# Patient Record
Sex: Female | Born: 1935 | Race: White | Hispanic: No | Marital: Married
Health system: Southern US, Community
[De-identification: ages and names within clinical notes are randomized; demographics above are authoritative.]

---

## 1997-12-28 ENCOUNTER — Other Ambulatory Visit: Admission: RE | Admit: 1997-12-28 | Discharge: 1997-12-28 | Payer: Self-pay | Admitting: Family Medicine

## 1998-08-20 ENCOUNTER — Encounter: Payer: Self-pay | Admitting: Emergency Medicine

## 1998-08-20 ENCOUNTER — Emergency Department (HOSPITAL_COMMUNITY): Admission: EM | Admit: 1998-08-20 | Discharge: 1998-08-20 | Payer: Self-pay | Admitting: Emergency Medicine

## 1999-12-30 ENCOUNTER — Ambulatory Visit (HOSPITAL_COMMUNITY): Admission: RE | Admit: 1999-12-30 | Discharge: 1999-12-30 | Payer: Self-pay | Admitting: Family Medicine

## 1999-12-30 ENCOUNTER — Encounter: Payer: Self-pay | Admitting: Family Medicine

## 2000-01-06 ENCOUNTER — Encounter: Payer: Self-pay | Admitting: Family Medicine

## 2000-01-06 ENCOUNTER — Encounter: Admission: RE | Admit: 2000-01-06 | Discharge: 2000-01-06 | Payer: Self-pay | Admitting: Family Medicine

## 2000-01-27 ENCOUNTER — Encounter: Admission: RE | Admit: 2000-01-27 | Discharge: 2000-02-19 | Payer: Self-pay | Admitting: Orthopaedic Surgery

## 2002-08-18 ENCOUNTER — Encounter: Payer: Self-pay | Admitting: Family Medicine

## 2002-08-18 ENCOUNTER — Encounter: Admission: RE | Admit: 2002-08-18 | Discharge: 2002-08-18 | Payer: Self-pay | Admitting: Family Medicine

## 2002-09-27 ENCOUNTER — Encounter: Admission: RE | Admit: 2002-09-27 | Discharge: 2002-09-27 | Payer: Self-pay | Admitting: Family Medicine

## 2002-09-27 ENCOUNTER — Encounter: Payer: Self-pay | Admitting: Family Medicine

## 2002-10-23 ENCOUNTER — Encounter: Payer: Self-pay | Admitting: Family Medicine

## 2002-10-23 ENCOUNTER — Encounter: Admission: RE | Admit: 2002-10-23 | Discharge: 2002-10-23 | Payer: Self-pay | Admitting: Family Medicine

## 2003-06-12 ENCOUNTER — Encounter: Admission: RE | Admit: 2003-06-12 | Discharge: 2003-06-12 | Payer: Self-pay | Admitting: Family Medicine

## 2003-06-12 ENCOUNTER — Encounter: Payer: Self-pay | Admitting: Family Medicine

## 2003-06-21 ENCOUNTER — Ambulatory Visit (HOSPITAL_COMMUNITY): Admission: RE | Admit: 2003-06-21 | Discharge: 2003-06-21 | Payer: Self-pay | Admitting: Family Medicine

## 2003-06-21 ENCOUNTER — Encounter: Payer: Self-pay | Admitting: Family Medicine

## 2003-10-23 ENCOUNTER — Encounter: Admission: RE | Admit: 2003-10-23 | Discharge: 2003-10-23 | Payer: Self-pay | Admitting: Family Medicine

## 2003-11-04 ENCOUNTER — Encounter: Admission: RE | Admit: 2003-11-04 | Discharge: 2003-11-04 | Payer: Self-pay | Admitting: Family Medicine

## 2003-11-13 ENCOUNTER — Ambulatory Visit (HOSPITAL_COMMUNITY): Admission: RE | Admit: 2003-11-13 | Discharge: 2003-11-13 | Payer: Self-pay | Admitting: Internal Medicine

## 2003-11-15 ENCOUNTER — Observation Stay (HOSPITAL_COMMUNITY): Admission: RE | Admit: 2003-11-15 | Discharge: 2003-11-16 | Payer: Self-pay | Admitting: Interventional Radiology

## 2003-11-15 ENCOUNTER — Encounter (INDEPENDENT_AMBULATORY_CARE_PROVIDER_SITE_OTHER): Payer: Self-pay | Admitting: Specialist

## 2003-12-06 ENCOUNTER — Encounter: Admission: RE | Admit: 2003-12-06 | Discharge: 2003-12-06 | Payer: Self-pay | Admitting: Family Medicine

## 2005-03-11 ENCOUNTER — Inpatient Hospital Stay (HOSPITAL_COMMUNITY): Admission: EM | Admit: 2005-03-11 | Discharge: 2005-03-14 | Payer: Self-pay | Admitting: Emergency Medicine

## 2006-04-13 ENCOUNTER — Encounter: Admission: RE | Admit: 2006-04-13 | Discharge: 2006-04-13 | Payer: Self-pay | Admitting: Family Medicine

## 2007-01-05 ENCOUNTER — Encounter: Admission: RE | Admit: 2007-01-05 | Discharge: 2007-01-05 | Payer: Self-pay | Admitting: Family Medicine

## 2007-01-18 ENCOUNTER — Ambulatory Visit (HOSPITAL_COMMUNITY): Admission: RE | Admit: 2007-01-18 | Discharge: 2007-01-18 | Payer: Self-pay | Admitting: Family Medicine

## 2007-01-22 ENCOUNTER — Encounter (INDEPENDENT_AMBULATORY_CARE_PROVIDER_SITE_OTHER): Payer: Self-pay | Admitting: Specialist

## 2007-01-22 ENCOUNTER — Ambulatory Visit (HOSPITAL_COMMUNITY): Admission: RE | Admit: 2007-01-22 | Discharge: 2007-01-22 | Payer: Self-pay | Admitting: Interventional Radiology

## 2007-02-05 ENCOUNTER — Ambulatory Visit (HOSPITAL_COMMUNITY): Admission: RE | Admit: 2007-02-05 | Discharge: 2007-02-05 | Payer: Self-pay | Admitting: Interventional Radiology

## 2007-02-15 ENCOUNTER — Ambulatory Visit (HOSPITAL_COMMUNITY): Admission: RE | Admit: 2007-02-15 | Discharge: 2007-02-15 | Payer: Self-pay | Admitting: Interventional Radiology

## 2008-12-27 ENCOUNTER — Emergency Department: Payer: Self-pay | Admitting: Emergency Medicine

## 2009-05-19 ENCOUNTER — Inpatient Hospital Stay: Payer: Self-pay | Admitting: Internal Medicine

## 2009-11-20 ENCOUNTER — Ambulatory Visit: Payer: Self-pay | Admitting: Family Medicine

## 2010-03-04 ENCOUNTER — Emergency Department: Payer: Self-pay | Admitting: Emergency Medicine

## 2010-06-09 ENCOUNTER — Inpatient Hospital Stay: Payer: Self-pay | Admitting: Internal Medicine

## 2010-11-14 ENCOUNTER — Ambulatory Visit: Payer: Self-pay | Admitting: Oncology

## 2010-11-15 ENCOUNTER — Observation Stay: Payer: Self-pay | Admitting: Specialist

## 2010-11-25 ENCOUNTER — Ambulatory Visit: Payer: Self-pay | Admitting: Oncology

## 2010-11-26 LAB — CEA: CEA: 1.4 ng/mL (ref 0.0–4.7)

## 2010-11-29 ENCOUNTER — Ambulatory Visit: Payer: Self-pay | Admitting: Oncology

## 2010-12-15 ENCOUNTER — Ambulatory Visit: Payer: Self-pay | Admitting: Oncology

## 2011-01-14 ENCOUNTER — Ambulatory Visit: Payer: Self-pay | Admitting: Oncology

## 2011-01-28 NOTE — Consult Note (Signed)
[name] [name], [name] [name]             ACCOUNT NO.:  [id number]   MEDICAL RECORD NO.:  [id number]          PATIENT TYPE:  OUT   LOCATION:  XRAY                         FACILITY:  [facility]   PHYSICIAN:  Sanjeev K. Deveshwar, M.D.DATE OF BIRTH:  04-05-1924   DATE OF CONSULTATION:  02/05/2007  DATE OF DISCHARGE:                                 CONSULTATION   CHIEF COMPLAINT:  Back pain.   HISTORY OF PRESENT ILLNESS:  This is a very pleasant, 77 year old female  with a history of compression fractures.  She had previously undergone  an L4 kyphoplasty on December 03, 2003, performed by Dr. Corliss Skains.  She  was seen recently in the office by Dr. Corliss Skains on Jan 18, 2007, once  again with back pain.  An MRI was performed at that time which showed an  acute fracture at L2.  The patient underwent a kyphoplasty at the L2  level on Jan 22, 2007.  She returns today accompanied by her daughter to  be seen in followup.   PAST MEDICAL HISTORY:  1. Hypertension.  2. History of previous acute renal failure.  3. Osteoporosis.  4. Mild Alzheimer's dementia.  5. Pyelonephritis.  6. History of TIAs.   PAST SURGICAL HISTORY:  1. Hysterectomy.  2. Cholecystectomy   ALLERGIES:  NO KNOWN DRUG ALLERGIES.   CURRENT MEDICATIONS:  Norvasc, aspirin, Vicodin and Aricept.   SOCIAL HISTORY:  The patient is married.  She has four children, two  daughters and two sons, one son is deceased.  One son has cancer.  Two  daughters are well.  She lives in [place] with her husband.  She is  a nonsmoker and a nondrinker.  She is retired from [employer].  She had been working as a Civil engineer, contracting represented until just recently.   FAMILY HISTORY:  Mother died at age 32 of unknown causes.  Her father  died in his 22s of cancer.  She has one sister who is alive, although  not well.   IMPRESSION:  As noted, the patient returns today to be seen in followup  after her L2 kyphoplasty performed Jan 22, 2007.  The patient  reports  that she continues to have severe pain.  She never had any relief  whatsoever following the kyphoplasty.  She had been on Percocet for  pain.  She ran out.  We did call her in a prescription for Vicodin.  She  has been taking Vicodin two tablets pretty much around the clock every 4-  6 hours.  However, she is not had not had any significant relief and her  pain.  She describes the pain as being a 10/10 scale.  She is pretty  much confined to bed and unable to do much in the way of self-care.  Her  daughter has been caring for her.  She has had a decreased appetite.  She denies any shortness of breath.  She has been somewhat constipated  from the pain medication.  She has not had a bowel movement in 2-3 days,  although she does not feel that this is totally abnormal for  her.   RECOMMENDATIONS:  Dr. Corliss Skains saw the patient.  He reviewed her  previous MRI as well as the results of the recent kyphoplasty.  The  situation was discussed with the patient and her daughter.  At this  point, there were felt to be two options, one to continue limited  mobility and pain medication for another week or two to see if her  symptoms improve on her own.  The second option would be to repeat an  MRI today to look for new fractures.  The decision was made to repeat  the MRI.  We are currently awaiting the results.  Greater than 30  minutes was spent on this consult.      Delton See, P.A.    ______________________________  Grandville Silos. Corliss Skains, M.D.    DR/MEDQ  D:  02/05/2007  T:  02/05/2007  Job:  [id number]   cc:   Quita Skye. Artis Flock, M.D.

## 2011-01-31 NOTE — H&P (Signed)
NAMEGWENDOLEN, [name] NO.:  [id number]   MEDICAL RECORD NO.:  [id number]          PATIENT TYPE:  EMS   LOCATION:  MAJO                         FACILITY:  [facility]   PHYSICIAN:  Hettie Holstein, D.O.    DATE OF BIRTH:  [birth year]   DATE OF ADMISSION:  03/11/2005  DATE OF DISCHARGE:                                HISTORY & PHYSICAL   PRIMARY CARE PHYSICIAN:  Rodolph Bong, M.D.   CHIEF COMPLAINT:  Poor intake and lightheadedness and nausea.   HISTORY OF PRESENT ILLNESS:  History is limited, as the patient is unable to  provide a thorough history due to intractable nausea and emesis. [name] is a pleasant 77 year old, independently living Caucasian female  with uncomplicated medical history who developed problems with nausea for  the past several days and inability to hold any food down because of nausea  and development of subjective fevers.  Presented to the emergency department  with finding of pyelonephritis clinically and renal insufficiency.   In the emergency department, she continued to have contractible nausea and  vomiting and generalized feeling unwell.  She is being admitted for IV  hydration, IV antibiotics and further observation.   PAST MEDICAL HISTORY:  Past medical history that she is able to provide at  present is:  1.  Previous history of hypertension.  2.  Status post cholecystectomy in the past.  3.  History of TIA in the past.  4.  Full hysterectomy in the past.   MEDICATIONS:  1.  Norvasc.  2.  She states she is on another blood pressure medication that she cannot      recall.  3.  Aspirin.   ALLERGIES:  No known drug allergies.   SOCIAL HISTORY:  The patient lives with her husband, denies tobacco for at  least greater than 10 years.  No alcohol.   FAMILY HISTORY:  Unobtainable at present.   REVIEW OF SYSTEMS:  Unobtainable at present.   PHYSICAL EXAMINATION:  VITAL SIGNS:  Blood pressure 119/53, temperature 100,  heart rate  91, respirations 18.  GENERAL APPEARANCE:  The patient is alert.  However, she is quite nauseated,  uncomfortable, vomiting no acute distress emesis and bilious emesis,  nonbloody.  HEENT:  Normocephalic, atraumatic.  Extraocular muscles intact.  Has  extremely poor dentition.  NECK:  Supple, nontender.  No palpable thyromegaly or mass.  CARDIOVASCULAR:  Normal S1, S2.  PULMONARY:  Lungs are clear bilaterally.  ABDOMEN:  Soft, nontender.  No acute costovertebral angle tenderness.  EXTREMITIES:  No edema.  NEUROLOGICAL:  Dysthymic.  Affect is stable.   LABORATORY DATA:  Based on the I-stat studies:  Sodium 134, potassium 3.0,  BUN 50, creatinine 1.9, glucose 158.  Urinalysis revealed WBC's at 2150, WBC  13, hemoglobin 12.5, platelets 206,000, MCV 87.   ASSESSMENT:  1.  Pyelonephritis.  2.  Hypertension.  3.  Extremely poor dentition.  4.  Acute renal failure, suspected secondary to volume depletion.  Will      follow.  Will need to discern which antihypertensive medication she was  on previously, as it may have been an ace inhibitor.  We will have to      question her when her acute issues resolve.  Will hold them for      toxications for now.   PLAN:  We are going to admit [name] [name] for IV antibiotics, IV hydration,  symptom management and supportive care.  Will follow her course clinically.  Checked renal ultrasound to evaluate her kidneys.       ESS/MEDQ  D:  03/11/2005  T:  03/11/2005  Job:  [id number]

## 2011-01-31 NOTE — Discharge Summary (Signed)
NAMEJAMIRRA, CURNOW             ACCOUNT NO.:  [id number]   MEDICAL RECORD NO.:  [id number]          PATIENT TYPE:  INP   LOCATION:  6729                         FACILITY:  [facility]   PHYSICIAN:  Lonia Blood, M.D.      DATE OF BIRTH:  12/11/1923   DATE OF ADMISSION:  03/11/2005  DATE OF DISCHARGE:                                 DISCHARGE SUMMARY   PRIMARY CARE PHYSICIAN:  Dr. Rodolph Bong.   DISCHARGE DIAGNOSES:  1.  Acute pyelonephritis.  2.  Dehydration with acute renal failure.  3.  Severe nausea and vomiting.  4.  Hypokalemia.  5.  Generalized debilitation.  6.  Hypertension.  7.  History of transient ischemic attack.   DISCHARGE MEDICATIONS:  1.  Ciprofloxacin 250 mg p.o. b.i.d. for five more days.  2.  K-Dur 20 mEq/L daily for five more days.  3.  Norvasc 5 mg daily as previously prescribed by Dr. Penni Bombard.  4.  The patient also supposedly took Avalide prior to admission and may      resume her medication at this point.   DISPOSITION:  The patient is discharged in very good health.  She is  tolerating all her meals and moving around.  She has been evaluated by PT/OT  in the hospital, and determined that she does not require home health PT or  OT.  The patient is also to follow with Dr. Markus Jarvis office on Monday,  where she will have a BMT checked to follow up with the level of her  potassium.  She is being discharged today after receiving two doses of 40  mEq per day of potassium for hypokalemia related to her nausea and vomiting.  The patient will also take a potassium supplement for the next three days  until she is seen in the office on Monday.  If her potassium has stayed  stable at that point, she will not require to continue potassium  supplements.   PROCEDURES PERFORMED:  1.  Head CT without contrast on March 11, 2005, showing atrophy without acute      intracranial abnormality.  2.  Chest x-ray on March 11, 2005, showed right apical pleural parenchymal  scarring, but no acute process.  3.  Renal ultrasound performed on March 11, 2005, shows tiny benign cysts in      the right kidney, otherwise unremarkable.   CONSULTATIONS:  None.   BRIEF HISTORY/PHYSICAL:  Please refer to the dictated history and physical  by Dr. Hannah Beat.  This is a 77 year old lady that was brought in  secondary to poor intake and intractable nausea and vomiting.  At the time  of arrival, she could not even give enough history due to continuous nausea  and vomiting, even at the emergency room.  The patient was also found at  that time to have acute renal insufficiency with a creatinine of 1.9, BUN  50, potassium of only 3.0.  Urinalysis revealed findings consistent with  UTI, as follows, positive nitrites, cloudy urine, moderate leukocyte  esterase.  Urine microscopy showed wbc's 21-50, rbc's 3-6, and many  bacteria.  She  was subsequently admitted for treatment of pyelonephritis as  a cause of her nausea and vomiting.   HOSPITAL COURSE:  Problem 1. Pyelonephritis.  The patient was initiated on  IV ciprofloxacin while urine culture and blood culture were sent out.  Her  urine culture came back as E. coli, which is pansensitive and resistant only  to ampicillin.  Since it is sensitive to quinolones, the patient was  maintained on her Cipro, which she reacted to tremendously.  She is  currently on oral antibiotics and will finish another five more days of  Cipro at home.  Her white count has remained normal from 10.0 on admission  to only 5.8 today.   Problem 2. Acute renal failure/dehydration.  This was most likely secondary  to her continuous nausea and vomiting.  The patient was aggressively  hydrated and has since become more or less hydrated.  Her antihypertensive  was held on admission due to relative hypotension, but now she is to resume  her Norvasc as well.   Problem 3. Hypokalemia.  The patient's potassium dropped as low at 2.5, most  likely secondary  to the intractable vomiting that she had prior to  admission.  This was initially repleted, and the process is still going on  at the time of this discharge.  She will need to have a potassium level  checked when she leaves the hospital.  I will see her on Monday.  I will  keep her on potassium supplements since her renal function is good.   Problem 4. Hypotension.  As indicated, the patient was dehydrated when she  came in, so she was initially hypotensive.  We took her off the Norvasc that  she was taking, but she can resume that at this present time with her  aspirin as previously taken.   Problem 5. Nausea/vomiting.  The patient's nausea and vomiting was most  likely secondary to her pyelonephritis.  She received IV Phenergan during  this hospitalization, as well as Zofran.  The patient's vomiting resolved  within the first 24 hours, and she was gradually restarted on her diet,  initially liquids and now a regular diet.   DISCHARGE LABORATORIES:  White count 5.8, hemoglobin 11.8, platelets  231,000.  Creatinine 0.7, BUN 4, potassium is still low at 2.5 prior to her  serum potassium this morning.       LG/MEDQ  D:  03/14/2005  T:  03/14/2005  Job:  [id number]   cc:   Rodolph Bong, M.D.  [address], [place] [address]  Fax: [phone]

## 2011-02-14 ENCOUNTER — Ambulatory Visit: Payer: Self-pay | Admitting: Oncology

## 2011-03-16 ENCOUNTER — Ambulatory Visit: Payer: Self-pay | Admitting: Oncology

## 2011-04-16 ENCOUNTER — Ambulatory Visit: Payer: Self-pay | Admitting: Oncology

## 2011-05-15 ENCOUNTER — Emergency Department: Payer: Self-pay | Admitting: Emergency Medicine

## 2011-07-03 LAB — CBC
Hemoglobin: 12.9
MCHC: 34
MCV: 86.8
RBC: 4.36
RDW: 14

## 2011-07-03 LAB — BASIC METABOLIC PANEL
CO2: 28
Chloride: 105
GFR calc Af Amer: >60
Glucose, Bld: 92
Sodium: 141

## 2011-07-04 ENCOUNTER — Ambulatory Visit: Payer: Self-pay | Admitting: Oncology

## 2011-07-17 ENCOUNTER — Ambulatory Visit: Payer: Self-pay | Admitting: Oncology

## 2011-08-29 ENCOUNTER — Ambulatory Visit: Payer: Self-pay | Admitting: Unknown Physician Specialty

## 2011-09-17 ENCOUNTER — Ambulatory Visit: Payer: Self-pay | Admitting: Oncology

## 2011-09-17 LAB — CBC CANCER CENTER
Basophil: 3 %
Comment - H1-Com3: NORMAL
Eosinophil %: 0.8 %
Eosinophil: 3 %
Lymphocytes: 26 %
Monocyte %: 21 %
Monocytes: 20 %
Neutrophil %: 55.3 %
Platelet: 153 x10 3/mm (ref 150–440)
RBC: 4.19 10*6/uL (ref 3.80–5.20)
Segmented Neutrophils: 48 %
WBC: 4.9 x10 3/mm (ref 3.6–11.0)

## 2011-09-17 LAB — COMPREHENSIVE METABOLIC PANEL
Anion Gap: 4 — ABNORMAL LOW (ref 7–16)
Bilirubin,Total: 1 mg/dL (ref 0.2–1.0)
Calcium, Total: 9.3 mg/dL (ref 8.5–10.1)
Co2: 29 mmol/L (ref 21–32)
EGFR (African American): >60
Total Protein: 7.2 g/dL (ref 6.4–8.2)

## 2011-10-17 ENCOUNTER — Ambulatory Visit: Payer: Self-pay | Admitting: Oncology

## 2011-12-11 ENCOUNTER — Emergency Department: Payer: Self-pay | Admitting: Emergency Medicine

## 2011-12-11 LAB — CBC
HCT: 41.5 % (ref 35.0–47.0)
HGB: 14.1 g/dL (ref 12.0–16.0)
MCH: 30.3 pg (ref 26.0–34.0)
RBC: 4.65 10*6/uL (ref 3.80–5.20)
RDW: 14 % (ref 11.5–14.5)

## 2011-12-11 LAB — COMPREHENSIVE METABOLIC PANEL
Alkaline Phosphatase: 64 U/L (ref 50–136)
BUN: 17 mg/dL (ref 7–18)
Co2: 22 mmol/L (ref 21–32)
EGFR (African American): >60
EGFR (Non-African Amer.): >60
Glucose: 89 mg/dL (ref 65–99)
Osmolality: 277 (ref 275–301)
SGPT (ALT): 12 U/L
Sodium: 138 mmol/L (ref 136–145)
Total Protein: 7.5 g/dL (ref 6.4–8.2)

## 2011-12-11 LAB — CK TOTAL AND CKMB (NOT AT ARMC): CK-MB: 1.1 ng/mL (ref 0.5–3.6)

## 2011-12-11 LAB — APTT: Activated PTT: 27.8 secs (ref 23.6–35.9)

## 2011-12-11 LAB — TROPONIN I: Troponin-I: <0.02 ng/mL

## 2011-12-11 LAB — PROTIME-INR: Prothrombin Time: 13.3 secs (ref 11.5–14.7)

## 2012-02-10 ENCOUNTER — Inpatient Hospital Stay: Payer: Self-pay | Admitting: Specialist

## 2012-02-10 LAB — TSH: Thyroid Stimulating Horm: 1.19 u[IU]/mL

## 2012-02-10 LAB — URINALYSIS, COMPLETE
Bilirubin,UR: NEGATIVE
Blood: NEGATIVE
Glucose,UR: NEGATIVE mg/dL (ref 0–75)
Nitrite: NEGATIVE
Ph: 8 (ref 4.5–8.0)
Protein: NEGATIVE

## 2012-02-10 LAB — COMPREHENSIVE METABOLIC PANEL
Alkaline Phosphatase: 86 U/L (ref 50–136)
Calcium, Total: 9.8 mg/dL (ref 8.5–10.1)
Co2: 26 mmol/L (ref 21–32)
Creatinine: 0.8 mg/dL (ref 0.60–1.30)
EGFR (African American): >60
Osmolality: 278 (ref 275–301)
Potassium: 3.7 mmol/L (ref 3.5–5.1)
SGPT (ALT): 14 U/L
Sodium: 139 mmol/L (ref 136–145)

## 2012-02-10 LAB — CBC
HCT: 38.4 % (ref 35.0–47.0)
MCHC: 34.2 g/dL (ref 32.0–36.0)
Platelet: 170 10*3/uL (ref 150–440)
RBC: 4.34 10*6/uL (ref 3.80–5.20)
RDW: 14 % (ref 11.5–14.5)
WBC: 5.7 10*3/uL (ref 3.6–11.0)

## 2012-02-10 LAB — CK TOTAL AND CKMB (NOT AT ARMC): CK, Total: 31 U/L (ref 21–215)

## 2012-02-11 LAB — CBC WITH DIFFERENTIAL/PLATELET
Basophil #: 0 10*3/uL (ref 0.0–0.1)
Basophil %: 0.8 %
Eosinophil %: 0.4 %
HCT: 35.6 % (ref 35.0–47.0)
HGB: 12.3 g/dL (ref 12.0–16.0)
Lymphocyte #: 0.9 10*3/uL — ABNORMAL LOW (ref 1.0–3.6)
Lymphocyte %: 16.1 %
Monocyte #: 1 x10 3/mm — ABNORMAL HIGH (ref 0.2–0.9)
Neutrophil #: 3.8 10*3/uL (ref 1.4–6.5)
Platelet: 158 10*3/uL (ref 150–440)
RBC: 4.04 10*6/uL (ref 3.80–5.20)
RDW: 13.8 % (ref 11.5–14.5)
WBC: 5.8 10*3/uL (ref 3.6–11.0)

## 2012-02-11 LAB — BASIC METABOLIC PANEL
Anion Gap: 9 (ref 7–16)
BUN: 12 mg/dL (ref 7–18)
Calcium, Total: 8.7 mg/dL (ref 8.5–10.1)
Chloride: 107 mmol/L (ref 98–107)
Creatinine: 0.82 mg/dL (ref 0.60–1.30)
EGFR (African American): >60
EGFR (Non-African Amer.): >60
Glucose: 116 mg/dL — ABNORMAL HIGH (ref 65–99)
Osmolality: 282 (ref 275–301)
Potassium: 3.5 mmol/L (ref 3.5–5.1)

## 2012-02-11 LAB — MAGNESIUM: Magnesium: 1.7 mg/dL — ABNORMAL LOW

## 2012-02-16 LAB — CULTURE, BLOOD (SINGLE)

## 2012-02-23 ENCOUNTER — Ambulatory Visit: Payer: Self-pay | Admitting: Family Medicine

## 2012-06-05 ENCOUNTER — Emergency Department: Payer: Self-pay | Admitting: Unknown Physician Specialty

## 2012-06-15 ENCOUNTER — Emergency Department: Payer: Self-pay | Admitting: Emergency Medicine

## 2013-03-06 IMAGING — CT CT CERVICAL SPINE WITHOUT CONTRAST
1 series · 16 of 18 positions shown, 19 images · non-contrast
Comparison: none

REASON FOR EXAM: s/p fall with pain (fall 13 days ago)
COMMENTS:

[Series 6: axial-lower · coronal · 0.33mm/px · 16 of 96 slices shown, 19 images]
[im 8/96  soft-tissue]
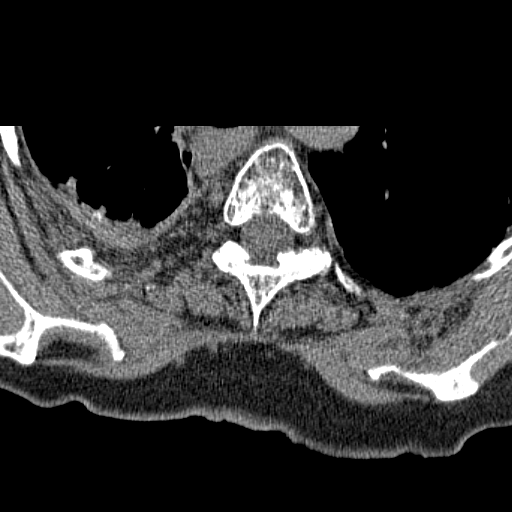
[im 8/96  bone]
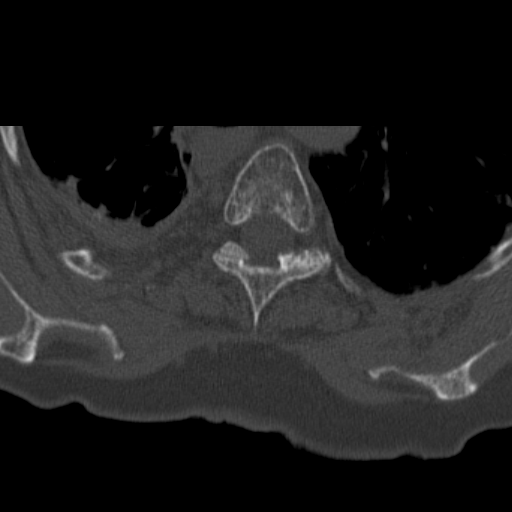
[im 15/96  bone]
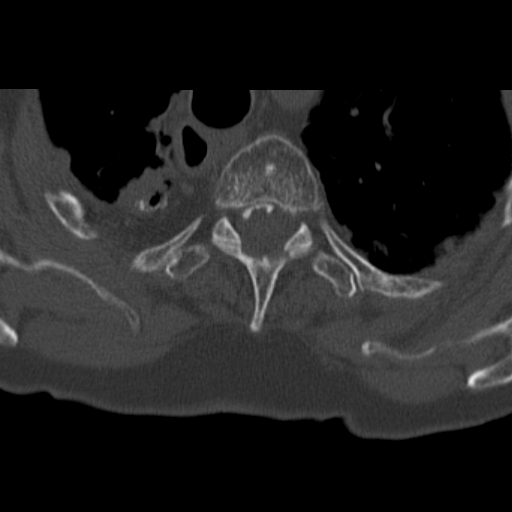
[im 22/96  bone]
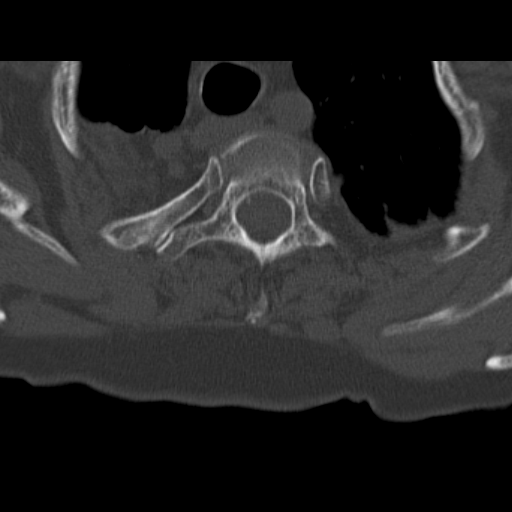
[im 30/96  bone]
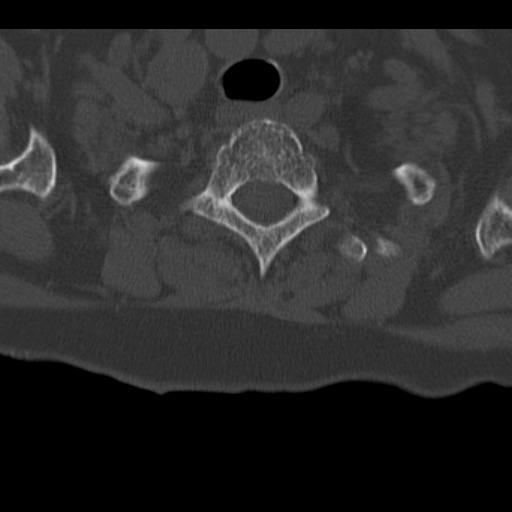
[im 37/96  soft-tissue]
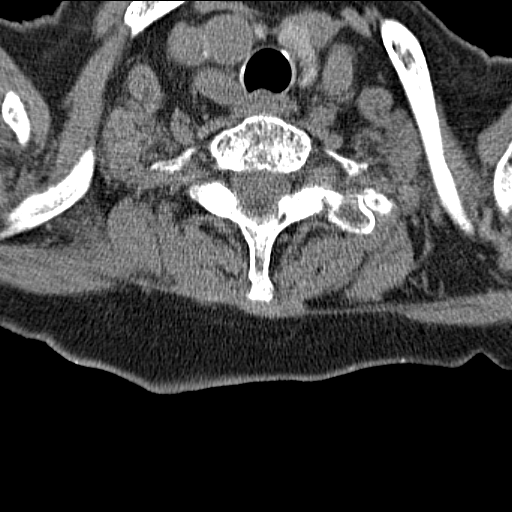
[im 37/96  bone]
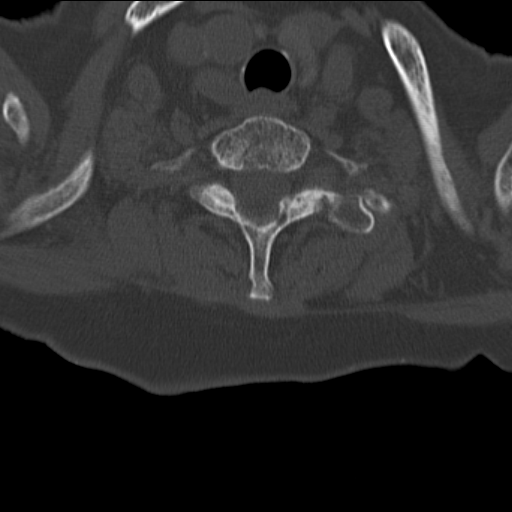
[im 44/96  bone]
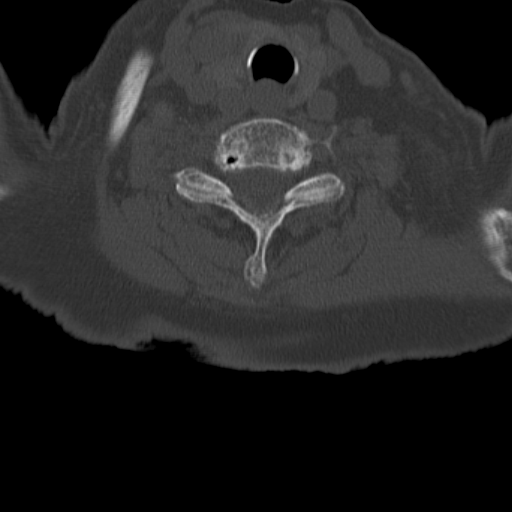
[im 52/96  bone]
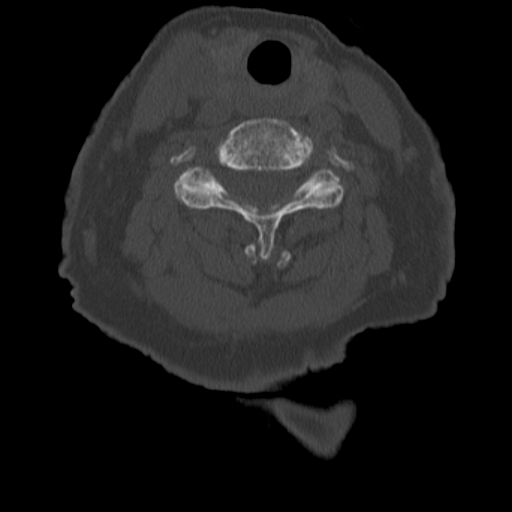
[im 59/96  bone]
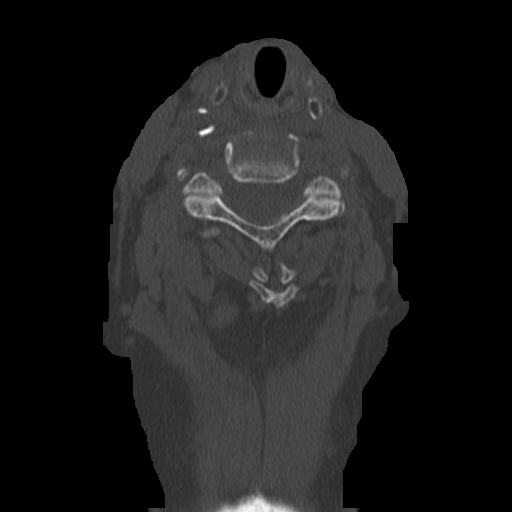
[im 66/96  soft-tissue]
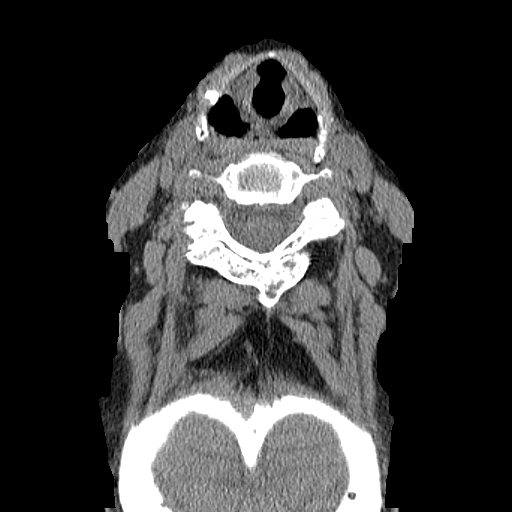
[im 66/96  bone]
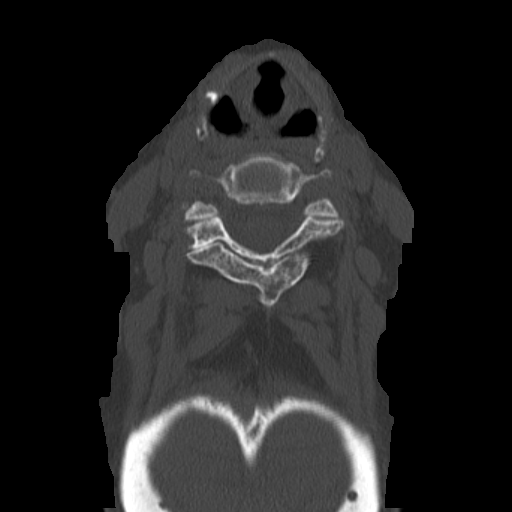
[im 74/96  bone]
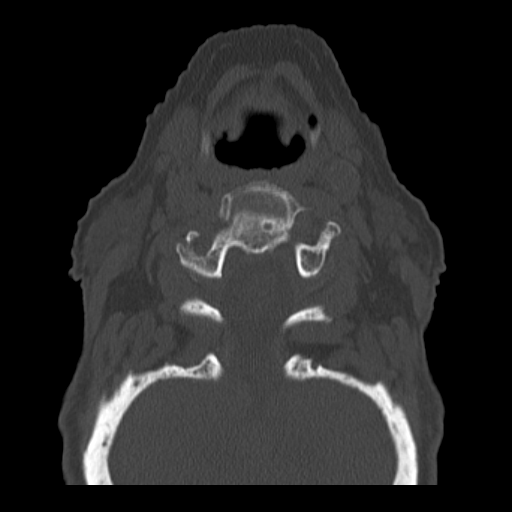
[im 81/96  bone]
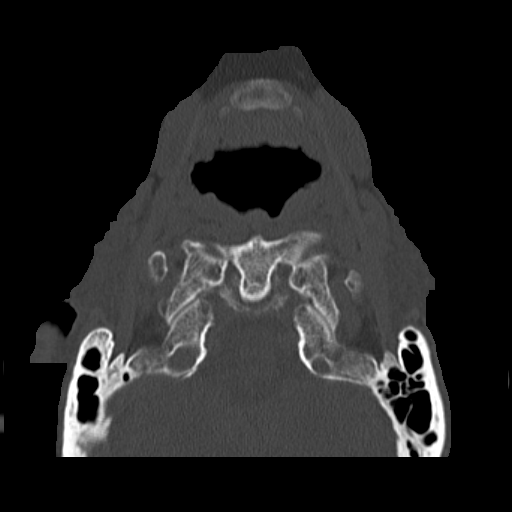
[im 88/96  bone]
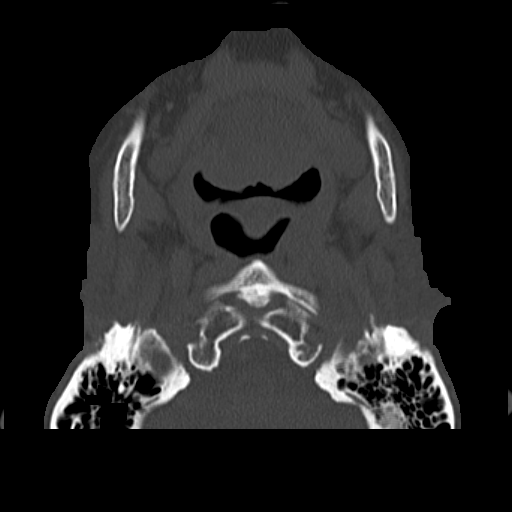
[im 92/96  bone]
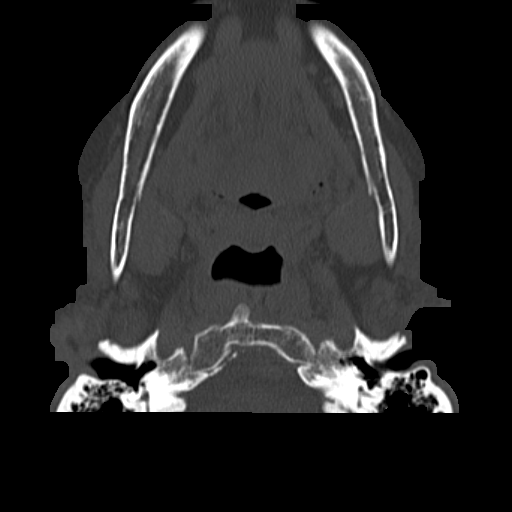
[im 93/96  bone]
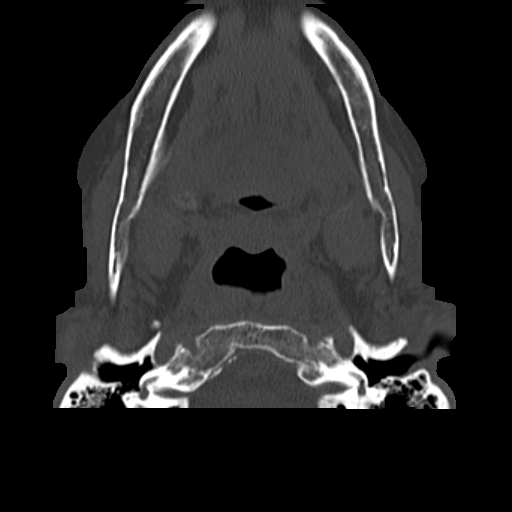
[im 94/96  bone]
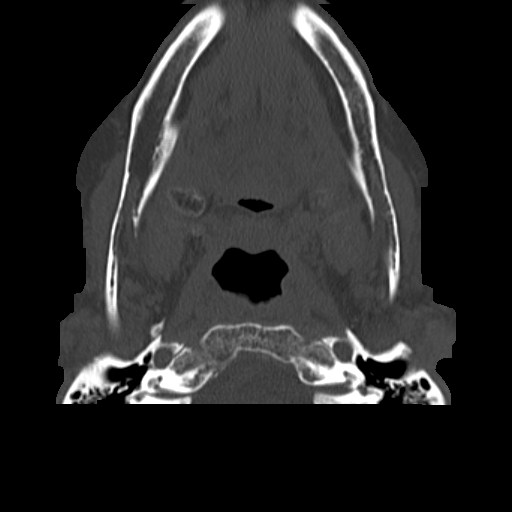
[im 95/96  bone]
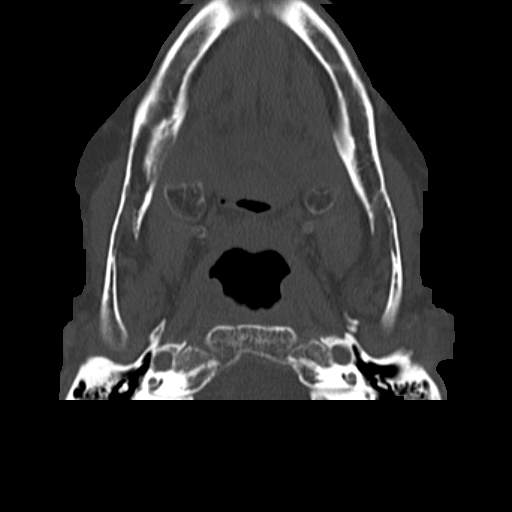

[16 of 18 positions shown; findings below may reference images not displayed]

PROCEDURE:     CT  - CT CERVICAL SPINE WO  - December 11, 2011 [DATE]

RESULT:     Sagittal, axial, and coronal images through the cervical spine
are reviewed.

There is prominent thoracic kyphosis which accentuates the cervical
lordosis. The cervical vertebral bodies are preserved in height. The
prevertebral soft tissue spaces appear normal. There is a bony bar posterior
to the body of T2. There is no evidence of a perched facet. The spinous
processes are intact. The bony ring at each cervical level is intact. The
lateral masses of C1 align normally with those of C2. The odontoid is
intact. There is apical pleural scarring in both lungs.
IMPRESSION: 1. I do not see evidence of an acute cervical spine fracture nor
dislocation. There is mild degenerative facet joint change at multiple
levels.
2. There is apical pleural thickening which is asymmetric toward the right.

## 2013-03-29 ENCOUNTER — Ambulatory Visit: Payer: Self-pay | Admitting: Oncology

## 2013-03-30 ENCOUNTER — Emergency Department: Payer: Self-pay | Admitting: Internal Medicine

## 2013-03-30 LAB — COMPREHENSIVE METABOLIC PANEL
Albumin: 3.8 g/dL (ref 3.4–5.0)
Alkaline Phosphatase: 95 U/L (ref 50–136)
Anion Gap: 11 (ref 7–16)
Bilirubin,Total: 1.6 mg/dL — ABNORMAL HIGH (ref 0.2–1.0)
Calcium, Total: 9.8 mg/dL (ref 8.5–10.1)
Co2: 21 mmol/L (ref 21–32)
EGFR (Non-African Amer.): 43 — ABNORMAL LOW
Glucose: 81 mg/dL (ref 65–99)
Osmolality: 270 (ref 275–301)
Potassium: 4.2 mmol/L (ref 3.5–5.1)
SGOT(AST): 14 U/L — ABNORMAL LOW (ref 15–37)
SGPT (ALT): 10 U/L — ABNORMAL LOW (ref 12–78)
Sodium: 135 mmol/L — ABNORMAL LOW (ref 136–145)

## 2013-03-30 LAB — CBC
HCT: 39.6 % (ref 35.0–47.0)
HGB: 13.7 g/dL (ref 12.0–16.0)
RBC: 4.73 10*6/uL (ref 3.80–5.20)
WBC: 4.9 10*3/uL (ref 3.6–11.0)

## 2013-03-30 LAB — TROPONIN I: Troponin-I: <0.02 ng/mL

## 2013-03-31 LAB — CBC CANCER CENTER
Basophil #: 0.1 x10 3/mm (ref 0.0–0.1)
Eosinophil #: 0.1 x10 3/mm (ref 0.0–0.7)
HCT: 38.2 % (ref 35.0–47.0)
HGB: 13.5 g/dL (ref 12.0–16.0)
MCH: 29.9 pg (ref 26.0–34.0)
MCHC: 35.4 g/dL (ref 32.0–36.0)
Monocyte #: 1.3 x10 3/mm — ABNORMAL HIGH (ref 0.2–0.9)
Neutrophil #: 3 x10 3/mm (ref 1.4–6.5)
Neutrophil %: 52.9 %
RBC: 4.52 10*6/uL (ref 3.80–5.20)
WBC: 5.6 x10 3/mm (ref 3.6–11.0)

## 2013-03-31 LAB — COMPREHENSIVE METABOLIC PANEL
Albumin: 3.5 g/dL (ref 3.4–5.0)
Alkaline Phosphatase: 87 U/L (ref 50–136)
Anion Gap: 11 (ref 7–16)
Bilirubin,Total: 1.4 mg/dL — ABNORMAL HIGH (ref 0.2–1.0)
Chloride: 102 mmol/L (ref 98–107)
Creatinine: 1.41 mg/dL — ABNORMAL HIGH (ref 0.60–1.30)
EGFR (African American): 39 — ABNORMAL LOW
EGFR (Non-African Amer.): 34 — ABNORMAL LOW
Glucose: 128 mg/dL — ABNORMAL HIGH (ref 65–99)
Osmolality: 275 (ref 275–301)
Potassium: 4.7 mmol/L (ref 3.5–5.1)
SGOT(AST): 13 U/L — ABNORMAL LOW (ref 15–37)
Sodium: 135 mmol/L — ABNORMAL LOW (ref 136–145)
Total Protein: 6.7 g/dL (ref 6.4–8.2)

## 2013-04-06 ENCOUNTER — Ambulatory Visit: Payer: Self-pay | Admitting: Oncology

## 2013-04-12 ENCOUNTER — Ambulatory Visit: Payer: Self-pay | Admitting: Gastroenterology

## 2013-04-13 LAB — PATHOLOGY REPORT

## 2013-04-15 ENCOUNTER — Ambulatory Visit: Payer: Self-pay | Admitting: Oncology

## 2013-05-06 IMAGING — CR DG CHEST 2V
1 series · 3 of 3 positions shown · non-contrast
Comparison: none

REASON FOR EXAM: shortness of breath
COMMENTS:

PROCEDURE:     DXR - DXR CHEST PA (OR AP) AND LATERAL  - February 10, 2012  [DATE]
RESULT:     Comparison is made to prior study dated 12/11/2011.

[Series 1: ap · 0.17mm/px · 3 of 3 slices shown]
[im 1/3]
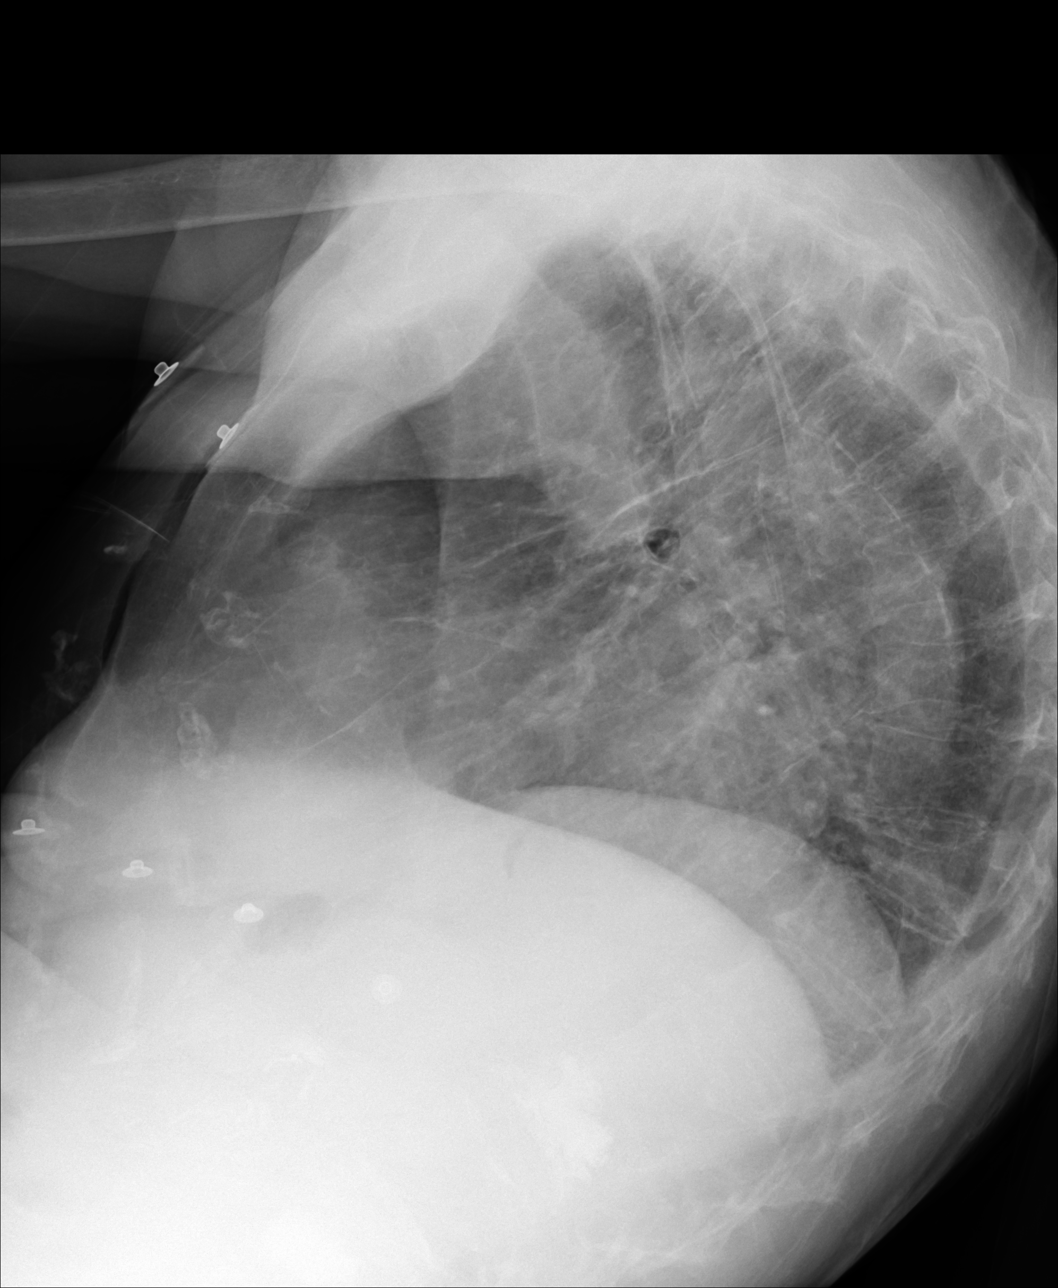
[im 2/3]
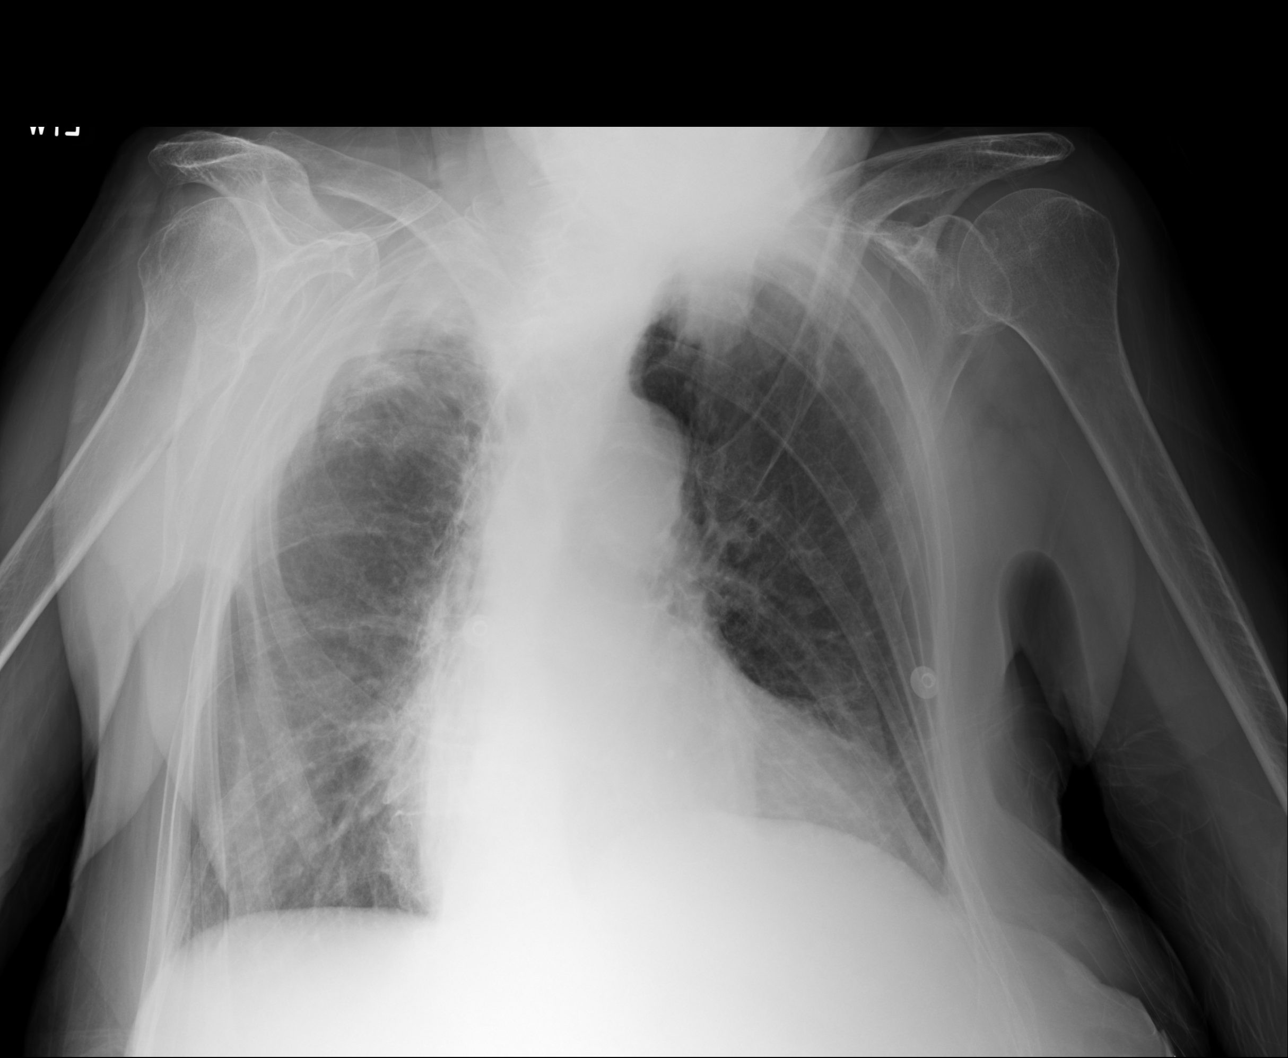
[im 3/3]
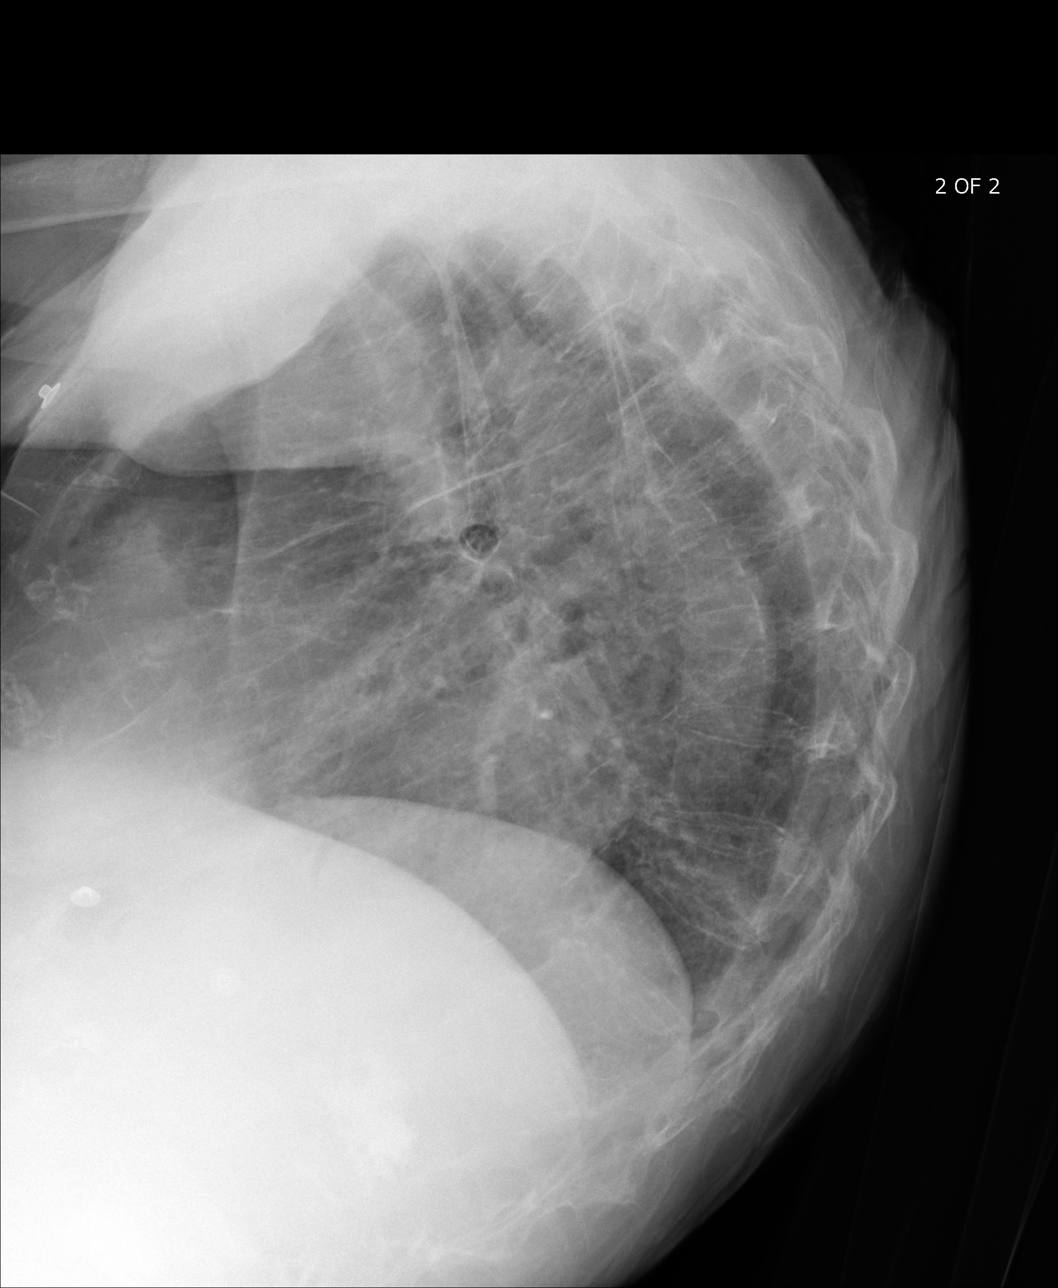

[3 of 3 positions shown; findings below may reference images not displayed]

FINDINGS: An area of increased density projects within the right upper lobe.
There is increased kyphotic curvature of the spine. The patient is tilted to
the left. There is mild increased density in the right hemithorax. The
cardiac silhouette is moderately enlarged. Compression deformity, likely
chronic is appreciated within the lower thoracic spine. Compression
deformity is identified within the lower thoracic spine.
IMPRESSION: 1. Atelectasis versus infiltrate right upper lobe.
2. COPD.
3. Compression deformities within the thoracic spine.
4. Osteopenia.

## 2013-05-06 IMAGING — CT CT ABD-PELV W/ CM
1 of 2 series · 15 of 32 positions shown, 19 images · non-contrast
Comparison: none

REASON FOR EXAM: (1) lower abdominal pain; (2) lower abdominal pain
COMMENTS:

PROCEDURE:     CT  - CT ABDOMEN / PELVIS  W  - February 10, 2012  [DATE]
RESULT:     CT abdomen and pelvis dated 02/10/2012. This study was compared
to a noncontrasted CT dated 11/15/2010.
TECHNIQUE: Helical 3 mm sections were obtained from the lung bases through
the pubic symphysis status post intravenous administration of 85 CULP of
[id number].

[Series 2: 3mm soft tissue · axial · 0.71mm/px · z∈[-446,-64]mm · 15 of 139 slices shown, 19 images]
[im 6/139  soft-tissue]
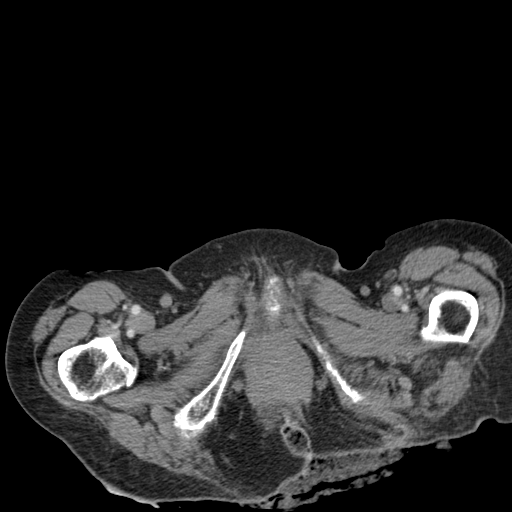
[im 6/139  bone]
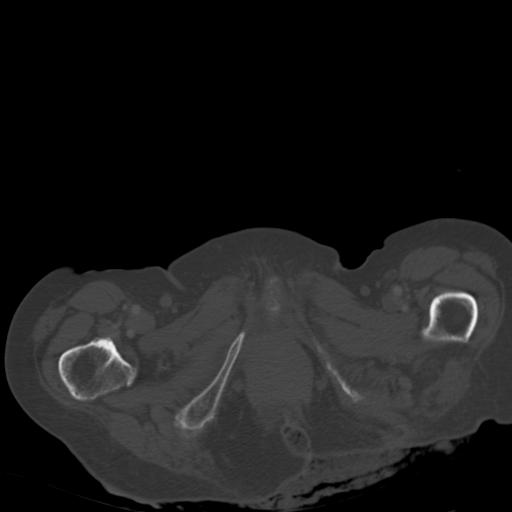
[im 17/139  soft-tissue]
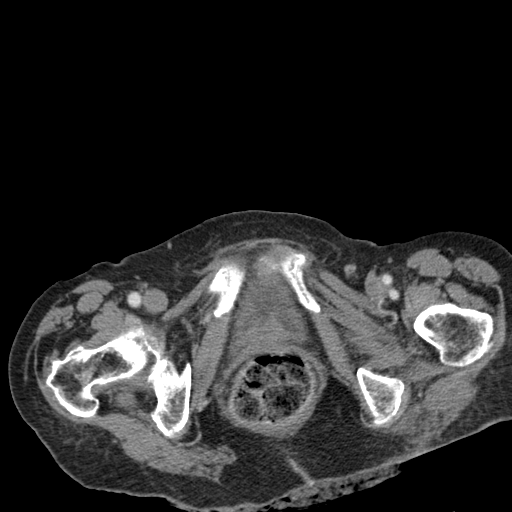
[im 28/139  soft-tissue]
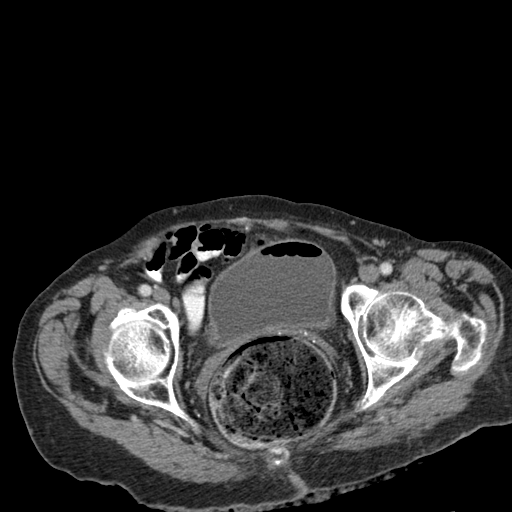
[im 39/139  soft-tissue]
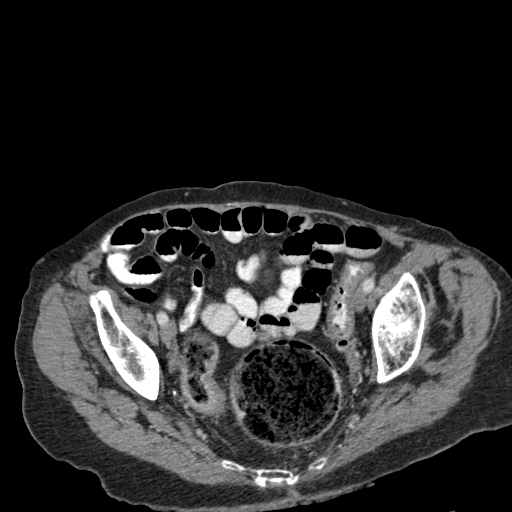
[im 50/139  soft-tissue]
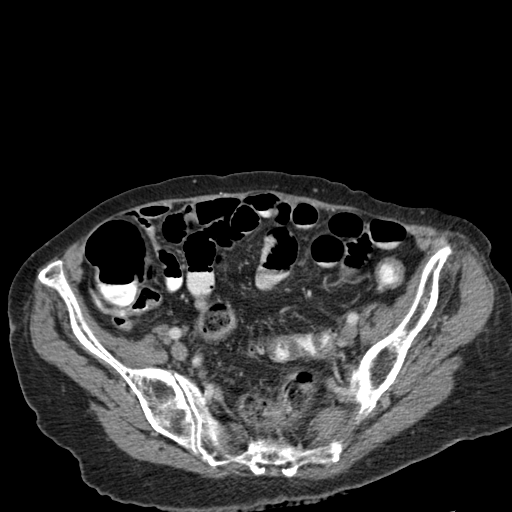
[im 61/139  soft-tissue]
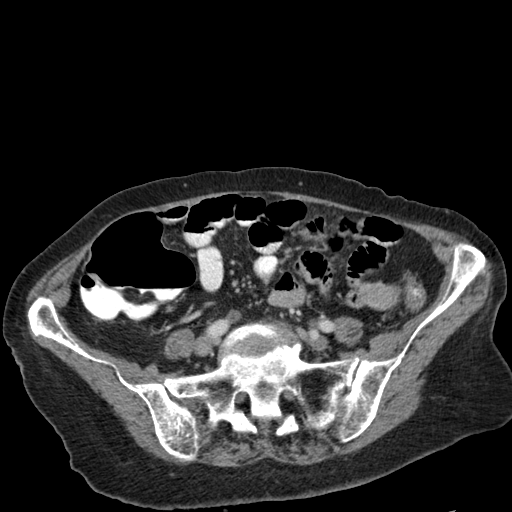
[im 72/139  soft-tissue]
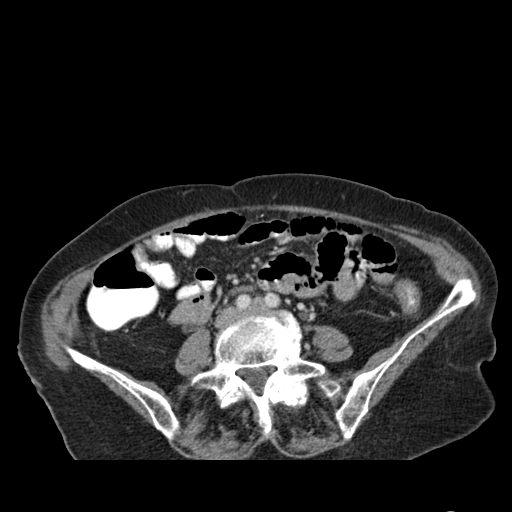
[im 78/139  soft-tissue]
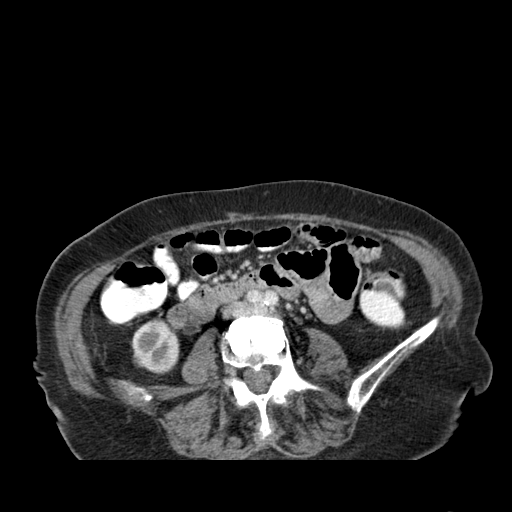
[im 89/139  soft-tissue]
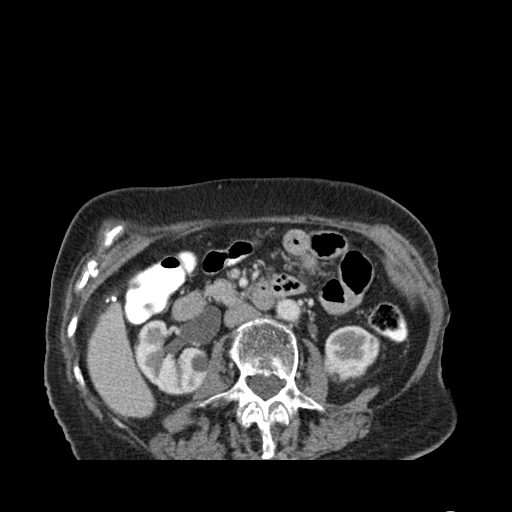
[im 89/139  bone]
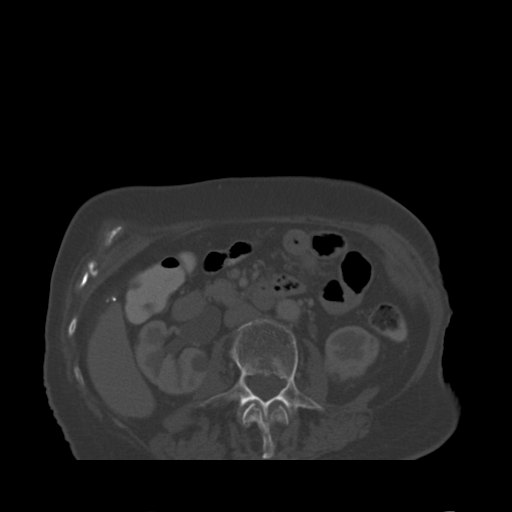
[im 100/139  soft-tissue]
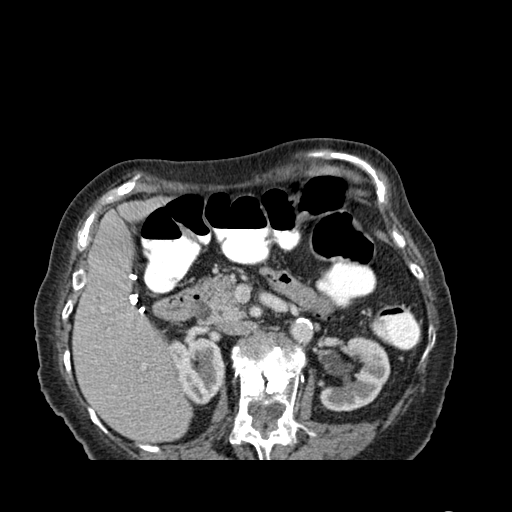
[im 111/139  soft-tissue]
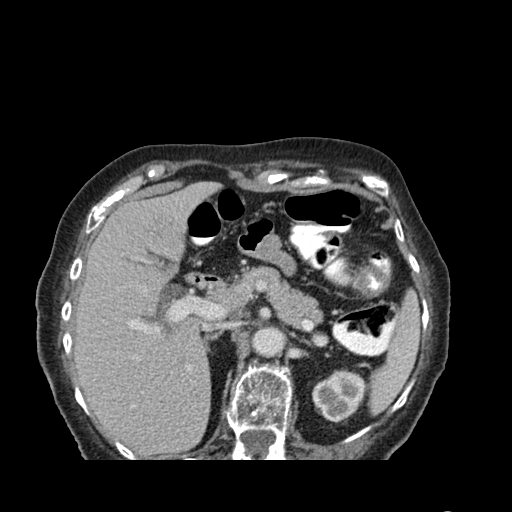
[im 116/139  lung]
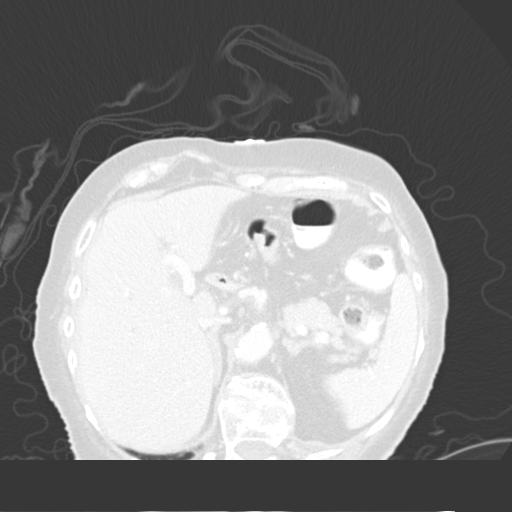
[im 122/139  soft-tissue]
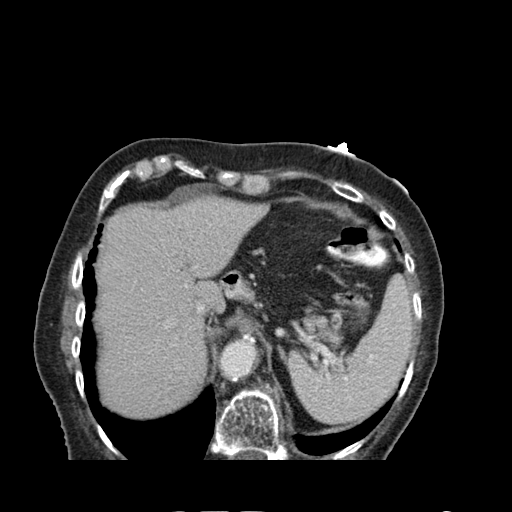
[im 122/139  lung]
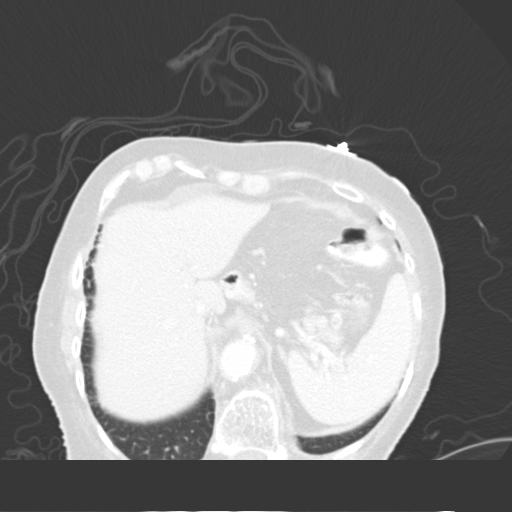
[im 127/139  lung]
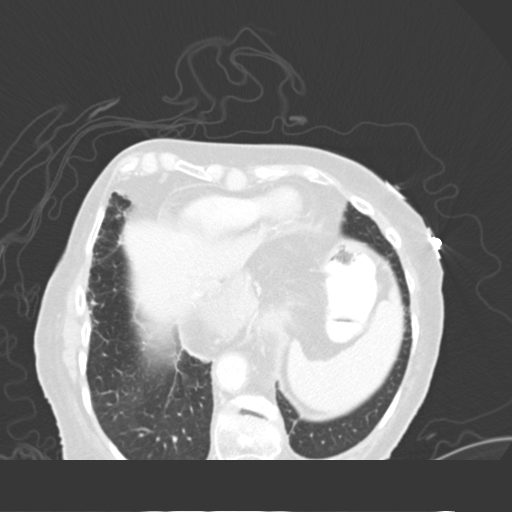
[im 133/139  soft-tissue]
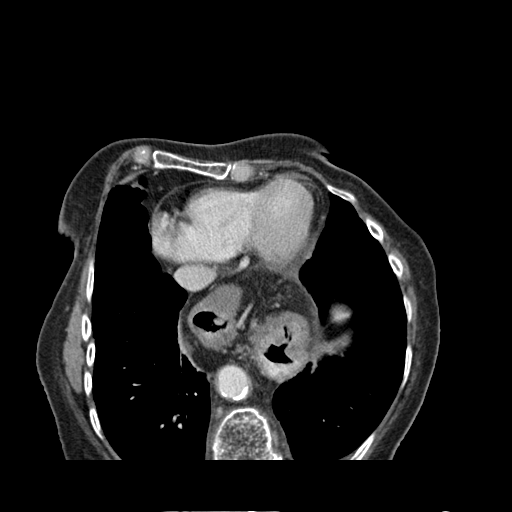
[im 133/139  lung]
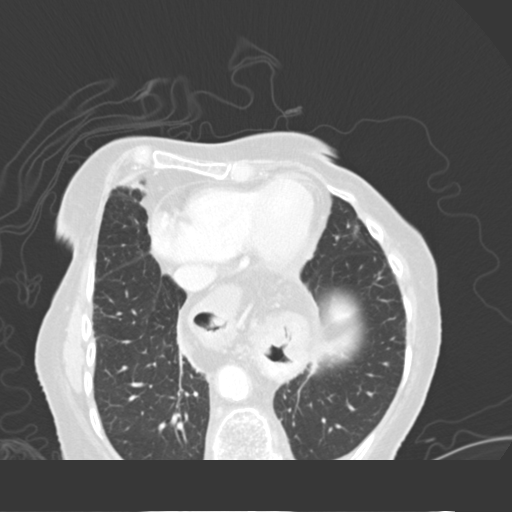

[15 of 32 positions shown; findings below may reference images not displayed]

FINDINGS: The lung bases demonstrate a moderate to large hiatal hernia as
well as interstitial and emphysematous changes in the lung bases. The liver,
spleen, adrenals, pancreas are unremarkable.

Evaluation of the right kidney demonstrates pelviectasis and mild
hydroureter. These findings are stable when compared to the previous study.
A stable 5.9 mm calcified density projects along the expected course of the
distal right ureter at the level of the pelvic inlet. This finding is
unchanged from prior study and like reps a phlebolith. The renal pelvis and
a renal findings are stable and chronic when compared to previous study. The
left kidney is unremarkable. There is no evidence of bowel obstruction,
enteritis, colitis or diverticulitis. A large amount stool is appreciated
within the rectosigmoid colon region areas finding is concerning for fecal
impaction. Air is appreciated within the dome of the urinary bladder which
may represent iatrogenic intervention clinical correlation recommended.

There is no evidence of abdominal aortic aneurysm. The celiac, SMA, IMA,
portal vein, SMV are opacified.
IMPRESSION: Large amount of stool within the rectosigmoid colon raising
the suspicion of fecal impaction.
2. Moderate to large ventral hernia
3. Chronic changes involving the right urinary collecting system.
4. Air within the urinary bladder clinical correlation recommended. Note a
small focus of air is appreciated than and anterior bladder wall
diverticulum.
5. Otherwise no evidence of obstructive or inflammatory abnormalities.

## 2013-05-16 ENCOUNTER — Ambulatory Visit: Payer: Self-pay | Admitting: Oncology

## 2013-06-15 DEATH — deceased

## 2015-01-07 NOTE — H&P (Signed)
PATIENT NAME:  [name] [name], [name]:  [id number] DATE OF BIRTH:  07/01/1924  DATE OF ADMISSION:  02/10/2012  ER PHYSICIAN: Dr. Clemens Catholicagsdale   PRIMARY CARE PHYSICIAN: Bobbye RiggsSylvia Mand, FNP at [facility]    ONCOLOGIST: Dr. Doylene Canninghoksi    CHIEF COMPLAINT: Altered mental status.   HISTORY OF PRESENT ILLNESS: The patient is an 81 year old female with past medical history of esophageal cancer status post radiation therapy currently in remission, urinary incontinence, hypertension, depression, anxiety, gastroesophageal reflux disease, and history of CVA in the 1990's who normally ambulates with a walker. The patient lives with her daughter, [name] [name]. As per the patient's daughter, the patient had four episodes of confusion today which lasted about 4 to 5 minutes. During the episodes the patient appeared to be dazed, confused. She would not respond to any stimulation. Three of those happened in front of EMS. Therefore, the patient's daughter got concerned and brought her to the Emergency Room. Currently the patient is complaining of abdominal pain and appears to be more or less back to her baseline. As per the patient's daughter, she has early signs of dementia including problems with short-term memory and repeating herself, however, has never had these kind of episodes in the past. She has had a CVA in the 1990's which had affected one part of her body but she subsequently recovered from that. Currently the patient has generalized weakness and normally ambulates with a walker.   ALLERGIES: No known drug allergies.   PAST MEDICAL HISTORY:  1. Recent diagnosis of esophageal cancer status post radiation therapy. The patient currently is in remission. 2. History of urinary incontinence. 3. Hypertension. 4. Gastroesophageal reflux disease. 5. Depression. 6. Anxiety. 7. History of cerebrovascular accident in the 1990's. 8. History of recurrent urinary tract infections. 9. History of fecal impaction and  obstipation.    PAST SURGICAL HISTORY:  1. Back surgery x2.  2. Bilateral cataract surgery. 3. Hysterectomy.   MEDICATIONS:   1. Potassium 20 mEq b.i.d.  2. Celexa 20 mg daily.  3. Lopressor 25 mg b.i.d.  4. Protonix 40 mg b.i.d.  5. Norvasc 5 mg daily.  6. Aspirin 81 mg daily.   FAMILY HISTORY: Positive for hypertension. The patient's youngest son died of cancer.   SOCIAL HISTORY: She lives in [place] with her [place]. There is no history of smoking, alcohol, or drug abuse. Her daughter, [name] [name], is her power-of-attorney.   REVIEW OF SYSTEMS: CONSTITUTIONAL: Denies any fever or fatigue. EYES: Denies any blurred or double vision. ENT: Denies any tinnitus or ear pain. RESPIRATORY: Denies any cough or wheezing. CARDIOVASCULAR: Denies any chest pain or palpitations. GI: Denies any nausea or vomiting. Reports abdominal pain. GU: Denies any dysuria or hematuria. ENDOCRINE: Denies any polyuria or nocturia. HEME/LYMPH: Denies any anemia or easy bruisability. INTEGUMENTARY: Denies any acne or rash. MUSCULOSKELETAL: Denies any swelling or gout. NEUROLOGIC: Denies any numbness or weakness. PSYCH: Has history of anxiety and depression.   PHYSICAL EXAMINATION:   VITAL SIGNS: Temperature 96.3, heart rate 93, respiratory rate 20, blood pressure 193/90, pulse oximetry 98% on room air.   GENERAL: The patient is an elderly Caucasian female sitting comfortably in bed not in acute distress.   HEAD: Atraumatic, normocephalic.    EYES: There is no pallor, icterus, or cyanosis. Pupils equal, round, and reactive to light and accommodation. Extraocular movements are intact.   ENT: Wet mucous membranes. No oropharyngeal erythema or thrush.   NECK: Supple. No masses. No JVD. No thyromegaly. No lymphadenopathy.  CHEST WALL: No tenderness to palpation. Not using accessory muscles of respiration. No intercostal muscle retractions.   LUNGS: Bilaterally clear to auscultation. No wheezing, rales, or  rhonchi.   CARDIOVASCULAR: S1, S2 regular. There is a systolic murmur. No rubs or gallops.   ABDOMEN: Soft, nondistended. There is mild diffuse tenderness especially in the left lower quadrant. Hypoactive bowel sounds.   SKIN: No rashes or lesions.   PERIPHERIES: Trace pedal edema. 1+ pedal pulses.   MUSCULOSKELETAL: No cyanosis or clubbing.   NEUROLOGIC: The patient is awake, alert, oriented to self and place. Generalized weakness. No focal neurological deficit. Cranial nerves grossly intact.   PSYCH: Normal mood and affect.   LABORATORY, DIAGNOSTIC, AND RADIOLOGICAL DATA: CAT scan of the abdomen showed large ventral hernia. Large amount of stool with suspicion of fecal impaction. Chronic changes involving the right urinary collecting system. Air within the urinary bladder. A small focus of air is appreciated. There is possible anterior bladder wall diverticulum.   CAT scan of the head shows no acute abnormalities.   CMP normal. CBC normal. Magnesium 1.6. Cardiac enzymes negative. TSH 1.19.   ASSESSMENT AND PLAN: This is an 81 year old female with past medical history of esophageal cancer status post radiation and hypertension who presents with altered mental status. As per the patient's daughter, she had four episodes of periods in which she became confused and appeared to be dazed and did not respond. 1. Altered mental status. Differential diagnoses includes TIA, possible absent seizures, possible infection versus delirium due to fecal impaction. Will obtain an MRI of the brain and EEG. The patient will likely need manual impaction. She has had similar problems in the past and had to be manually disimpacted. Will start on stool softeners and laxatives. Will also obtain blood and urine cultures and a PT consult.  2. Accelerated hypertension possibly due to acute distress and acute abdominal pain. Will restart the patient's current medications and adjust to achieve good hypertensive control.   3. Fecal impaction with large ventral hernia. The patient will need manual disimpaction. Will start on stool softeners and laxatives.  4. Air in the bladder. The patient did not have any instrumentation. Will do a urine culture. Start on empiric antibiotics. Obtain a Urology consultation.  5. Hypomagnesemia. Will replace.  6. Depression/anxiety. Will continue Celexa. 7. History of gastroesophageal reflux disease. Will continue PPI.   Reviewed all medical records, discussed with the ED physician, discussed with the patient and her daughter the plan of care and management.   TIME SPENT: 75 minutes.   ____________________________ Darrick Meigs, MD sp:drc D: 02/10/2012 20:33:30 ET T: 02/11/2012 09:34:33 ET JOB#: [id number]  cc: Darrick Meigs, MD, <Dictator> Bobbye Riggs, FNP Darrick Meigs MD ELECTRONICALLY SIGNED 02/11/2012 15:01

## 2015-01-07 NOTE — Discharge Summary (Signed)
PATIENT NAME:  [name], [name] MR#:  [id number] DATE OF BIRTH:  10/20/1923  DATE OF ADMISSION:  05/[name] Ham28/2013 DATE OF DISCHARGE:  02/11/2012  For a detailed note, please see the History and Physical done on admission by Dr. Dava NajjarPanwar.   DIAGNOSES AT DISCHARGE:  1. Altered mental status likely secondary to urinary tract infection.  2. Urinary tract infection.  3. Hypertension.  4. Nephrolithiasis.  5. Depression.  6. Gastroesophageal reflux disease.  7. History of esophageal cancer.   DIET: The patient is being discharged on a low-sodium diet.   ACTIVITY: As tolerated.   FOLLOWUP:  1. Follow up with [name] RiggsSylvia Mand, FNP at [facility] in the next 1 to 2 weeks.  2. Follow up within one week at [name] [name]'s office.   DISCHARGE MEDICATIONS:  1. Amlodipine 5 mg daily.  2. Celexa 20 mg daily.  3. Metoprolol titrate 25 mg b.i.d.  4. Protonix 40 mg b.i.d. 5. Potassium 20 mEq b.i.d.  6. Aspirin 81 mg daily.  7. Ciprofloxacin 250 mg  b.i.d. times 7 days.   PERTINENT STUDIES: MRI of the brain done without contrast showing no acute infarct. CT of the head done without contrast showing chronic and involutional changes without evidence of acute abnormalities. CT of the abdomen and pelvis showing large amount of stool in the rectosigmoid colon suggestive of fecal impaction, moderate to large ventral hernia, chronic changes involving the right urinary collecting system, air within the urinary bladder. No evidence of obstructive inflammatory abnormalities.   HOSPITAL COURSE: This is an 81 year old female with multiple medical problems as mentioned above who presented to the hospital secondary to altered mental status and confusion.  1. Altered mental status/confusion: I think this is likely due to urinary tract infection. Her urine cultures grew out 100,000 colonies of gram-negative rod and a urinalysis was positive. The sensitivities and ID are still pending. She was treated with IV ceftriaxone and her  mental status is now back to baseline. She did have a CT scan of the head and MRI done, both of which are negative, and as I mentioned, her mental status is now back down to baseline.  2. Urinary tract infection, likely the cause of altered mental status: The patient currently is being discharged on a p.o. course of ciprofloxacin. She was maintained on IV ceftriaxone here in the hospital.  3. Nephrolithiasis with air within the urinary bladder: The patient does have nephrolithiasis seen on the CT and also air within the urinary bladder.  A urology consult was obtained. I discussed over the phone with Dr. Assunta GamblesBrian [name], who was supposed to be seeing the patient, but there is no acute intervention currently needed. He recommended close followup as an outpatient and getting a repeat KUB in one week. Therefore, I am setting up followup with [name] [name]'s office in one week. She will continue antibiotics for treatment for her urinary tract infection for now. As mentioned, she does not have any hematuria. She does not have any hydronephrosis and renal function is normal.  4. Hypertension: The patient remained hemodynamically stable. She will resume her metoprolol upon discharge.  5. Depression: The patient was maintained on Celexa and she will continue that.  6. Gastroesophageal reflux disease: The patient was maintained on Protonix. She will continue that upon discharge, too.  7. History of esophageal cancer: This is currently in remission and no acute issues related to this presently.  8. CODE STATUS: The patient is a DO NOT INTUBATE, DO NOT RESUSCITATE.   DISPOSITION:  She is being discharged home with Home Health physical therapy services.          TIME SPENT: 40 minutes    ____________________________ [name] [name]. [name] Kaiser, [name] vjs:bjt D: 02/11/2012 15:51:10 ET T: 02/12/2012 12:19:34 ET JOB#: [id number]  cc: [name] [name]. [name] Kaiser, [name], <Dictator> [name] [name]. [name] Dunk, [name] [name] Riggs, FNP [name] [name]  [name] [name] 02/13/2012 16:15
